# Patient Record
Sex: Male | Born: 1995 | Race: White | Hispanic: No | Marital: Single | Smoking: Current every day smoker
Health system: Southern US, Community
[De-identification: ages and names within clinical notes are randomized; demographics above are authoritative.]

## PROBLEM LIST (undated history)

## (undated) DIAGNOSIS — J45909 Unspecified asthma, uncomplicated: Secondary | ICD-10-CM

---

## 2020-06-15 ENCOUNTER — Ambulatory Visit: Payer: Self-pay | Admitting: Medical-Surgical

## 2020-06-15 DIAGNOSIS — Z7689 Persons encountering health services in other specified circumstances: Secondary | ICD-10-CM

## 2020-06-15 DIAGNOSIS — Z1159 Encounter for screening for other viral diseases: Secondary | ICD-10-CM

## 2020-06-15 DIAGNOSIS — Z114 Encounter for screening for human immunodeficiency virus [HIV]: Secondary | ICD-10-CM

## 2020-08-26 ENCOUNTER — Emergency Department (HOSPITAL_COMMUNITY)
Admission: EM | Admit: 2020-08-26 | Discharge: 2020-08-26 | Disposition: A | Discharge status: Home / Self Care | Payer: Self-pay | Attending: Emergency Medicine | Admitting: Emergency Medicine

## 2020-08-26 ENCOUNTER — Emergency Department (HOSPITAL_COMMUNITY): Payer: Self-pay

## 2020-08-26 ENCOUNTER — Encounter (HOSPITAL_COMMUNITY): Payer: Self-pay | Admitting: Emergency Medicine

## 2020-08-26 ENCOUNTER — Other Ambulatory Visit: Payer: Self-pay

## 2020-08-26 DIAGNOSIS — R590 Localized enlarged lymph nodes: Secondary | ICD-10-CM

## 2020-08-26 DIAGNOSIS — J020 Streptococcal pharyngitis: Secondary | ICD-10-CM | POA: Insufficient documentation

## 2020-08-26 DIAGNOSIS — F17200 Nicotine dependence, unspecified, uncomplicated: Secondary | ICD-10-CM | POA: Insufficient documentation

## 2020-08-26 DIAGNOSIS — L040 Acute lymphadenitis of face, head and neck: Secondary | ICD-10-CM | POA: Insufficient documentation

## 2020-08-26 LAB — GROUP A STREP BY PCR: Group A Strep by PCR: DETECTED — AB

## 2020-08-26 LAB — MONONUCLEOSIS SCREEN: Mono Screen: POSITIVE — AB

## 2020-08-26 MED ORDER — AMOXICILLIN 500 MG PO CAPS: 500.0000 mg | ORAL_CAPSULE | Freq: Two times a day (BID) | ORAL | 0 refills | Status: AC

## 2020-08-26 NOTE — Discharge Instructions (Addendum)
Please take the entire course of amoxicillin, 1 tablet twice a day for the next 10 days You may take naproxen twice a day as needed for pain in your throat Please follow-up your MyChart to look for the results of your monotest I would highly recommend that you follow-up with a family doctor within 2 weeks for a recheck of your lymph nodes, the glands in your neck should get smaller with the antibiotics, this definitely needs to be rechecked to make sure they are getting better Please make sure that you are not using IV drugs as this can cause certain bloodstream infections that can be quite serious and even fatal Emergency department for severe or worsening symptoms

## 2020-08-26 NOTE — ED Provider Notes (Signed)
[facility] EMERGENCY DEPARTMENT Provider Note   CSN: [id number] Arrival date & time: 08/26/20  1653     History Chief Complaint  Patient presents with  . Sore Throat    Francis Montgomery is a 25 y.o. male.  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  The patient states that about a week ago he developed some lymphadenopathy on the right side of his neck, sore throat, some drainage, some coughing and has since that time coughed up some dark appearing material and what he thought was some blood in the sputum.  At this time he feels like he has some postnasal drip and a sore throat.  He denies any definite exposure to gonorrhea, denies any known exposure to mononucleosis, he has not had fevers or chills at this time.  He denies abdominal pain, nausea, diarrhea, swelling of the legs or rashes or any other complaints.  Symptoms are persistent, nothing seems to make it better or worse.  This is his first medical evaluation for this  History reviewed. No pertinent past medical history.  There are no problems to display for this patient.   History reviewed. No pertinent surgical history.     No family history on file.  Social History   Tobacco Use  . Smoking status: Current Every Day Smoker  Substance Use Topics  . Alcohol use: Yes  . Drug use: Never    Home Medications Prior to Admission medications   Medication Sig Start Date End Date Taking? Authorizing Provider  amoxicillin (AMOXIL) 500 MG capsule Take 1 capsule (500 mg total) by mouth 2 (two) times daily for 10 days. 08/26/20 09/05/20 Yes Eber Hong, MD    Allergies    Patient has no known allergies.  Review of Systems   Review of Systems  Physical Exam Updated Vital Signs BP 129/90 (BP Location: Left Arm)   Pulse (!) 109   Temp 99.1 F (37.3 C)   Resp 18   SpO2 100%   Physical  Exam Vitals and nursing note reviewed.  Constitutional:      General: He is not in acute distress.    Appearance: He is well-developed.  HENT:     Head: Normocephalic and atraumatic.     Nose: No congestion or rhinorrhea.     Mouth/Throat:     Mouth: Mucous membranes are moist.     Pharynx: Oropharyngeal exudate and posterior oropharyngeal erythema present. No pharyngeal swelling or uvula swelling.     Comments: There is no trismus or torticollis, he is able to fully range of motion his neck, his oropharynx is erythematous with a small amount of purulent material on the tonsils and dripping down the back of the throat, tongue is normal, phonation is normal Eyes:     General: No scleral icterus.       Right eye: No discharge.        Left eye: No discharge.     Conjunctiva/sclera: Conjunctivae normal.     Pupils: Pupils are equal, round, and reactive to light.  Neck:     Thyroid: No thyromegaly.     Vascular: No JVD.     Comments: Isolated enlarged mildly tender cervical lymph node in the right anterior chain Cardiovascular:     Rate and Rhythm: Normal rate and regular rhythm.     Heart sounds: Normal heart sounds. No murmur  heard. No friction rub. No gallop.   Pulmonary:     Effort: Pulmonary effort is normal. No respiratory distress.     Breath sounds: Normal breath sounds. No wheezing or rales.  Abdominal:     General: Bowel sounds are normal. There is no distension.     Palpations: Abdomen is soft. There is no mass.     Tenderness: There is no abdominal tenderness.     Comments: No hepatosplenomegaly  Musculoskeletal:        General: No tenderness. Normal range of motion.     Cervical back: Normal range of motion and neck supple.  Lymphadenopathy:     Cervical: Cervical adenopathy present.  Skin:    General: Skin is warm and dry.     Findings: No erythema or rash.  Neurological:     Mental Status: He is alert.     Coordination: Coordination normal.  Psychiatric:         Behavior: Behavior normal.     ED Results / Procedures / Treatments   Labs (all labs ordered are listed, but only abnormal results are displayed) Labs Reviewed  GROUP A STREP BY PCR - Abnormal; Notable for the following components:      Result Value   Group A Strep by PCR DETECTED (*)    All other components within normal limits  MONONUCLEOSIS SCREEN    EKG None  Radiology DG Chest 2 View  Result Date: 08/26/2020 CLINICAL DATA:  Productive cough EXAM: CHEST - 2 VIEW COMPARISON:  None. FINDINGS: The heart size and mediastinal contours are within normal limits. Both lungs are clear. The visualized skeletal structures are unremarkable. IMPRESSION: No active cardiopulmonary disease. Electronically Signed   By: Jasmine Pang M.D.   On: 08/26/2020 17:34    Procedures Procedures   Medications Ordered in ED Medications - No data to display  ED Course  I have reviewed the triage vital signs and the nursing notes.  Pertinent labs & imaging results that were available during my care of the patient were reviewed by me and considered in my medical decision making (see chart for details).    MDM Rules/Calculators/A&P                          The patient's chest x-ray is unremarkable, there is no signs of pneumonia, his rapid strep test was positive, the monotest is pending and I have recommended that the patient be evaluated in the office within 2 weeks for a recheck to make sure he has improved lymphadenopathy, also want to make sure he does not have any other lymphadenopathy though I did not see any on today's exam.  The patient is agreeable and will watch his results on MyChart  Final Clinical Impression(s) / ED Diagnoses Final diagnoses:  Acute streptococcal pharyngitis  Cervical lymphadenopathy    Rx / DC Orders ED Discharge Orders         Ordered    amoxicillin (AMOXIL) 500 MG capsule  2 times daily        08/26/20 1958           Eber Hong, MD 08/26/20 2003

## 2020-08-26 NOTE — ED Triage Notes (Signed)
Pt c/o sore throat, lymph node swelling, cough and dark mucous.

## 2020-08-26 NOTE — ED Provider Notes (Signed)
Emergency Medicine Provider Triage Evaluation Note  Francis Montgomery 25 y.o. male was evaluated in triage.  Pt complains of swollen nodes, sore throat, cough.  States that about a week ago, he noticed that his lymph node, particularly on the right side of his neck was swollen.  States then he started feeling like his throat was irritated and noted some white spots on it.  He has not any difficulty swallowing, duct tolerating secretions, difficulty breathing.  That he has had some low-grade fevers at home.  No night sweats, weight loss.  He states today he started coughing and started noticing some dark mucus when he coughed up.  He does smoke cigarettes about half pack of cigarettes a day.  No difficulty breathing, chest pain.   Review of Systems  Positive: Cough, lymphadenopathy, sore through Negative: CP  Physical Exam  BP 134/82   Pulse 70   Temp 98.2 F (36.8 C) (Oral)   Resp 18   Ht 5\' 4"  (1.626 m)   Wt 65.8 kg   SpO2 100%   BMI 24.89 kg/m  Gen:   Awake, no distress   HEENT:  Atraumatic.  Anterior cervical lymphadenopathy noted on the right.  Posterior oropharynx is erythematous.  Uvula is midline, airways patent, phonation is intact. Resp:  Normal effort  Cardiac:  Normal rate  Abd:   Nondistended, nontender  MSK:   Moves extremities without difficulty  Neuro:  Speech clear   Other:  N/A    Medical Decision Making  Medically screening exam initiated at 5:01 PM  Appropriate orders placed.  Francis Montgomery was informed that the remainder of the evaluation will be completed by another provider, this initial triage assessment does not replace that evaluation, and the importance of remaining in the ED until their evaluation is complete.   Clinical Impression  Swollen lymph nodes    Portions of this note were generated with Dragon dictation software. Dictation errors may occur despite best attempts at proofreading.     Leta Jungling, PA-C 08/26/20 1703    10/26/20,  MD 08/26/20 [phone]

## 2020-12-14 ENCOUNTER — Other Ambulatory Visit: Payer: Self-pay

## 2020-12-14 ENCOUNTER — Emergency Department (HOSPITAL_BASED_OUTPATIENT_CLINIC_OR_DEPARTMENT_OTHER): Payer: Self-pay

## 2020-12-14 ENCOUNTER — Encounter (HOSPITAL_BASED_OUTPATIENT_CLINIC_OR_DEPARTMENT_OTHER): Payer: Self-pay | Admitting: *Deleted

## 2020-12-14 ENCOUNTER — Emergency Department (HOSPITAL_BASED_OUTPATIENT_CLINIC_OR_DEPARTMENT_OTHER)
Admission: EM | Admit: 2020-12-14 | Discharge: 2020-12-14 | Disposition: A | Discharge status: Home / Self Care | Payer: Self-pay | Attending: Emergency Medicine | Admitting: Emergency Medicine

## 2020-12-14 DIAGNOSIS — J45909 Unspecified asthma, uncomplicated: Secondary | ICD-10-CM | POA: Insufficient documentation

## 2020-12-14 DIAGNOSIS — M25512 Pain in left shoulder: Secondary | ICD-10-CM | POA: Insufficient documentation

## 2020-12-14 DIAGNOSIS — J029 Acute pharyngitis, unspecified: Secondary | ICD-10-CM | POA: Insufficient documentation

## 2020-12-14 DIAGNOSIS — R21 Rash and other nonspecific skin eruption: Secondary | ICD-10-CM | POA: Insufficient documentation

## 2020-12-14 DIAGNOSIS — Z711 Person with feared health complaint in whom no diagnosis is made: Secondary | ICD-10-CM

## 2020-12-14 DIAGNOSIS — F17210 Nicotine dependence, cigarettes, uncomplicated: Secondary | ICD-10-CM | POA: Insufficient documentation

## 2020-12-14 DIAGNOSIS — R590 Localized enlarged lymph nodes: Secondary | ICD-10-CM | POA: Insufficient documentation

## 2020-12-14 DIAGNOSIS — Z8619 Personal history of other infectious and parasitic diseases: Secondary | ICD-10-CM | POA: Insufficient documentation

## 2020-12-14 HISTORY — DX: Unspecified asthma, uncomplicated: J45.909

## 2020-12-14 LAB — CBC WITH DIFFERENTIAL/PLATELET
Abs Immature Granulocytes: 0.04 10*3/uL (ref 0.00–0.07)
Basophils Absolute: 0.1 10*3/uL (ref 0.0–0.1)
Basophils Relative: 1 %
Eosinophils Absolute: 0.1 10*3/uL (ref 0.0–0.5)
Eosinophils Relative: 1 %
HCT: 42.3 % (ref 39.0–52.0)
Hemoglobin: 14.2 g/dL (ref 13.0–17.0)
Immature Granulocytes: 0 %
Lymphocytes Relative: 65 %
Lymphs Abs: 7.2 10*3/uL — ABNORMAL HIGH (ref 0.7–4.0)
MCH: 28.6 pg (ref 26.0–34.0)
MCHC: 33.6 g/dL (ref 30.0–36.0)
MCV: 85.1 fL (ref 80.0–100.0)
Monocytes Absolute: 0.5 10*3/uL (ref 0.1–1.0)
Monocytes Relative: 5 %
Neutro Abs: 3.1 10*3/uL (ref 1.7–7.7)
Neutrophils Relative %: 28 %
Platelets: 343 10*3/uL (ref 150–400)
RBC: 4.97 MIL/uL (ref 4.22–5.81)
RDW: 14 % (ref 11.5–15.5)
Smear Review: ADEQUATE
WBC Morphology: ABNORMAL
WBC: 11.1 10*3/uL — ABNORMAL HIGH (ref 4.0–10.5)
nRBC: 0 % (ref 0.0–0.2)

## 2020-12-14 LAB — BASIC METABOLIC PANEL
Anion gap: 9 (ref 5–15)
BUN: 14 mg/dL (ref 6–20)
CO2: 26 mmol/L (ref 22–32)
Calcium: 8.4 mg/dL — ABNORMAL LOW (ref 8.9–10.3)
Chloride: 102 mmol/L (ref 98–111)
Creatinine, Ser: 0.93 mg/dL (ref 0.61–1.24)
GFR, Estimated: >60 mL/min (ref >60)
Glucose, Bld: 88 mg/dL (ref 70–99)
Potassium: 3.9 mmol/L (ref 3.5–5.1)
Sodium: 137 mmol/L (ref 135–145)

## 2020-12-14 LAB — RAPID HIV SCREEN (HIV 1/2 AB+AG)
HIV 1/2 Antibodies: NONREACTIVE
HIV-1 P24 Antigen - HIV24: NONREACTIVE

## 2020-12-14 LAB — MONONUCLEOSIS SCREEN: Mono Screen: NEGATIVE

## 2020-12-14 LAB — GROUP A STREP BY PCR: Group A Strep by PCR: NOT DETECTED

## 2020-12-14 NOTE — ED Provider Notes (Signed)
[facility] Provider Note: Lowella Dell, MD, FACEP  CSN: [id number] MRN: [id number] ARRIVAL: 12/14/20 at 0101 ROOM: [facility]   CHIEF COMPLAINT  Lymphadenopathy   HISTORY OF PRESENT ILLNESS  12/14/20 1:46 AM Francis Montgomery is a 25 y.o. male with a history of methamphetamine abuse including IV drug use.  He is here with about a week of enlarged, but not tender, lymph nodes of his neck and inguinal regions.  He is concerned he may have a blood infection due to IV drug use and would like to be treated for this.  He has also had a sore throat.  He has a history of mononucleosis about 3 months ago and strep pharyngitis.  He has scattered maculopapular rash and has been having 3 weeks of pain in his left shoulder.  He rates his pain as a 9 out of 10, worse with movement.  He states he is going to start drug rehab soon and has been "clean" for 3 days.   Past Medical History:  Diagnosis Date   Asthma     History reviewed. No pertinent surgical history.  History reviewed. No pertinent family history.  Social History   Tobacco Use   Smoking status: Every Day    Types: Cigarettes  Substance Use Topics   Alcohol use: Yes   Drug use: Yes    Types: IV    Prior to Admission medications   Medication Sig Start Date End Date Taking? Authorizing Provider  sertraline (ZOLOFT) 50 MG tablet Take 50 mg by mouth daily.   Yes [provider]    Allergies Patient has no known allergies.   REVIEW OF SYSTEMS  Negative except as noted here or in the History of Present Illness.   PHYSICAL EXAMINATION  Initial Vital Signs Blood pressure 119/78, pulse 91, temperature 98.1 F (36.7 C), temperature source Oral, resp. rate 18, height 6' (1.829 m), weight 79.4 kg, SpO2 100 %.  Examination General: Well-developed, well-nourished male in no acute distress; appearance consistent with age of record HENT: normocephalic; atraumatic; mild pharyngeal erythema without exudate Eyes:  pupils equal, round and reactive to light; extraocular muscles intact Neck: supple; nontender anterior cervical lymph nodes Heart: regular rate and rhythm Lungs: clear to auscultation bilaterally Abdomen: soft; nondistended; nontender; no palpable inguinal lymphadenopathy; bowel sounds present Extremities: No deformity; full range of motion except pain on range of motion of left shoulder; no palpable axillary lymphadenopathy; pulses normal Neurologic: Awake, alert and oriented; motor function intact in all extremities and symmetric; no facial droop Skin: Warm and dry; diffuse, sparse maculopapular rash at different stages of healing Psychiatric: Normal mood and affect   RESULTS  Summary of this visit's results, reviewed and interpreted by myself:   EKG Interpretation  Date/Time:    Ventricular Rate:    PR Interval:    QRS Duration:   QT Interval:    QTC Calculation:   R Axis:     Text Interpretation:         Laboratory Studies: Results for orders placed or performed during the hospital encounter of 12/14/20 (from the past 24 hour(s))  Mononucleosis screen     Status: None   Collection Time: 12/14/20  2:12 AM  Result Value Ref Range   Mono Screen NEGATIVE NEGATIVE  CBC with Differential/Platelet     Status: Abnormal   Collection Time: 12/14/20  2:12 AM  Result Value Ref Range   WBC 11.1 (H) 4.0 - 10.5 K/uL   RBC 4.97 4.22 -  5.81 MIL/uL   Hemoglobin 14.2 13.0 - 17.0 g/dL   HCT 43.3 29.5 - 18.8 %   MCV 85.1 80.0 - 100.0 fL   MCH 28.6 26.0 - 34.0 pg   MCHC 33.6 30.0 - 36.0 g/dL   RDW 41.6 60.6 - 30.1 %   Platelets 343 150 - 400 K/uL   nRBC 0.0 0.0 - 0.2 %   Neutrophils Relative % 28 %   Neutro Abs 3.1 1.7 - 7.7 K/uL   Lymphocytes Relative 65 %   Lymphs Abs 7.2 (H) 0.7 - 4.0 K/uL   Monocytes Relative 5 %   Monocytes Absolute 0.5 0.1 - 1.0 K/uL   Eosinophils Relative 1 %   Eosinophils Absolute 0.1 0.0 - 0.5 K/uL   Basophils Relative 1 %   Basophils Absolute 0.1 0.0 -  0.1 K/uL   WBC Morphology Abnormal lymphocytes present    RBC Morphology MORPHOLOGY UNREMARKABLE    Smear Review PLATELETS APPEAR ADEQUATE    Immature Granulocytes 0 %   Abs Immature Granulocytes 0.04 0.00 - 0.07 K/uL  Basic metabolic panel     Status: Abnormal   Collection Time: 12/14/20  2:12 AM  Result Value Ref Range   Sodium 137 135 - 145 mmol/L   Potassium 3.9 3.5 - 5.1 mmol/L   Chloride 102 98 - 111 mmol/L   CO2 26 22 - 32 mmol/L   Glucose, Bld 88 70 - 99 mg/dL   BUN 14 6 - 20 mg/dL   Creatinine, Ser 6.01 0.61 - 1.24 mg/dL   Calcium 8.4 (L) 8.9 - 10.3 mg/dL   GFR, Estimated >09 >32 mL/min   Anion gap 9 5 - 15  Rapid HIV screen (HIV 1/2 Ab+Ag)     Status: None   Collection Time: 12/14/20  2:12 AM  Result Value Ref Range   HIV-1 P24 Antigen - HIV24 NON REACTIVE NON REACTIVE   HIV 1/2 Antibodies NON REACTIVE NON REACTIVE   Interpretation (HIV Ag Ab)      A non reactive test result means that HIV 1 or HIV 2 antibodies and HIV 1 p24 antigen were not detected in the specimen.  Group A Strep by PCR     Status: None   Collection Time: 12/14/20  2:12 AM   Specimen: Peripheral; Sterile Swab  Result Value Ref Range   Group A Strep by PCR NOT DETECTED NOT DETECTED   Imaging Studies: DG Shoulder Left  Result Date: 12/14/2020 CLINICAL DATA:  Pain EXAM: LEFT SHOULDER - 2+ VIEW COMPARISON:  None. FINDINGS: No fracture or dislocation is seen. The joint spaces are preserved. Visualized soft tissues are within normal limits. Visualized left lung is clear. IMPRESSION: Negative. Electronically Signed   By: Charline Bills M.D.   On: 12/14/2020 03:35    ED COURSE and MDM  Nursing notes, initial and subsequent vitals signs, including pulse oximetry, reviewed and interpreted by myself.  Vitals:   12/14/20 0114 12/14/20 0115 12/14/20 0200 12/14/20 0219  BP: 119/78  110/76   Pulse: 91  96   Resp: 18  16   Temp: 98.1 F (36.7 C)     TempSrc: Oral     SpO2: 100%  100% 100%  Weight:   79.4 kg    Height:  6' (1.829 m)     Medications - No data to display  Patient advised of reassuring diagnostic studies including negative HIV test.  The blood cultures and pathology smear review are not yet resulted but these are flagged  for follow-up.  He was advised to take Aleve or ibuprofen for his left shoulder pain.  The rash is likely due to methamphetamine abuse.  PROCEDURES  Procedures   ED DIAGNOSES     ICD-10-CM   1. Concern about infectious disease without diagnosis  Z71.1          Paulena Servais, MD 12/14/20 [id number]

## 2020-12-14 NOTE — ED Triage Notes (Signed)
Pt reports swollen lymph nodes-hx of mono and strep-completed antibiotics. States blood in stool, left shoulder pain also. Pt requests to be treated for 'infection in blood' due to IV drug use.

## 2020-12-15 LAB — PATHOLOGIST SMEAR REVIEW: Path Review: REACTIVE

## 2020-12-19 LAB — CULTURE, BLOOD (ROUTINE X 2)
Culture: NO GROWTH
Culture: NO GROWTH
Special Requests: ADEQUATE
Special Requests: ADEQUATE

## 2021-12-14 IMAGING — DX DG CHEST 2V
2 series · 2 of 2 positions shown · non-contrast
Comparison: None.

CLINICAL DATA: Productive cough

EXAM:
CHEST - 2 VIEW

[chest pa]
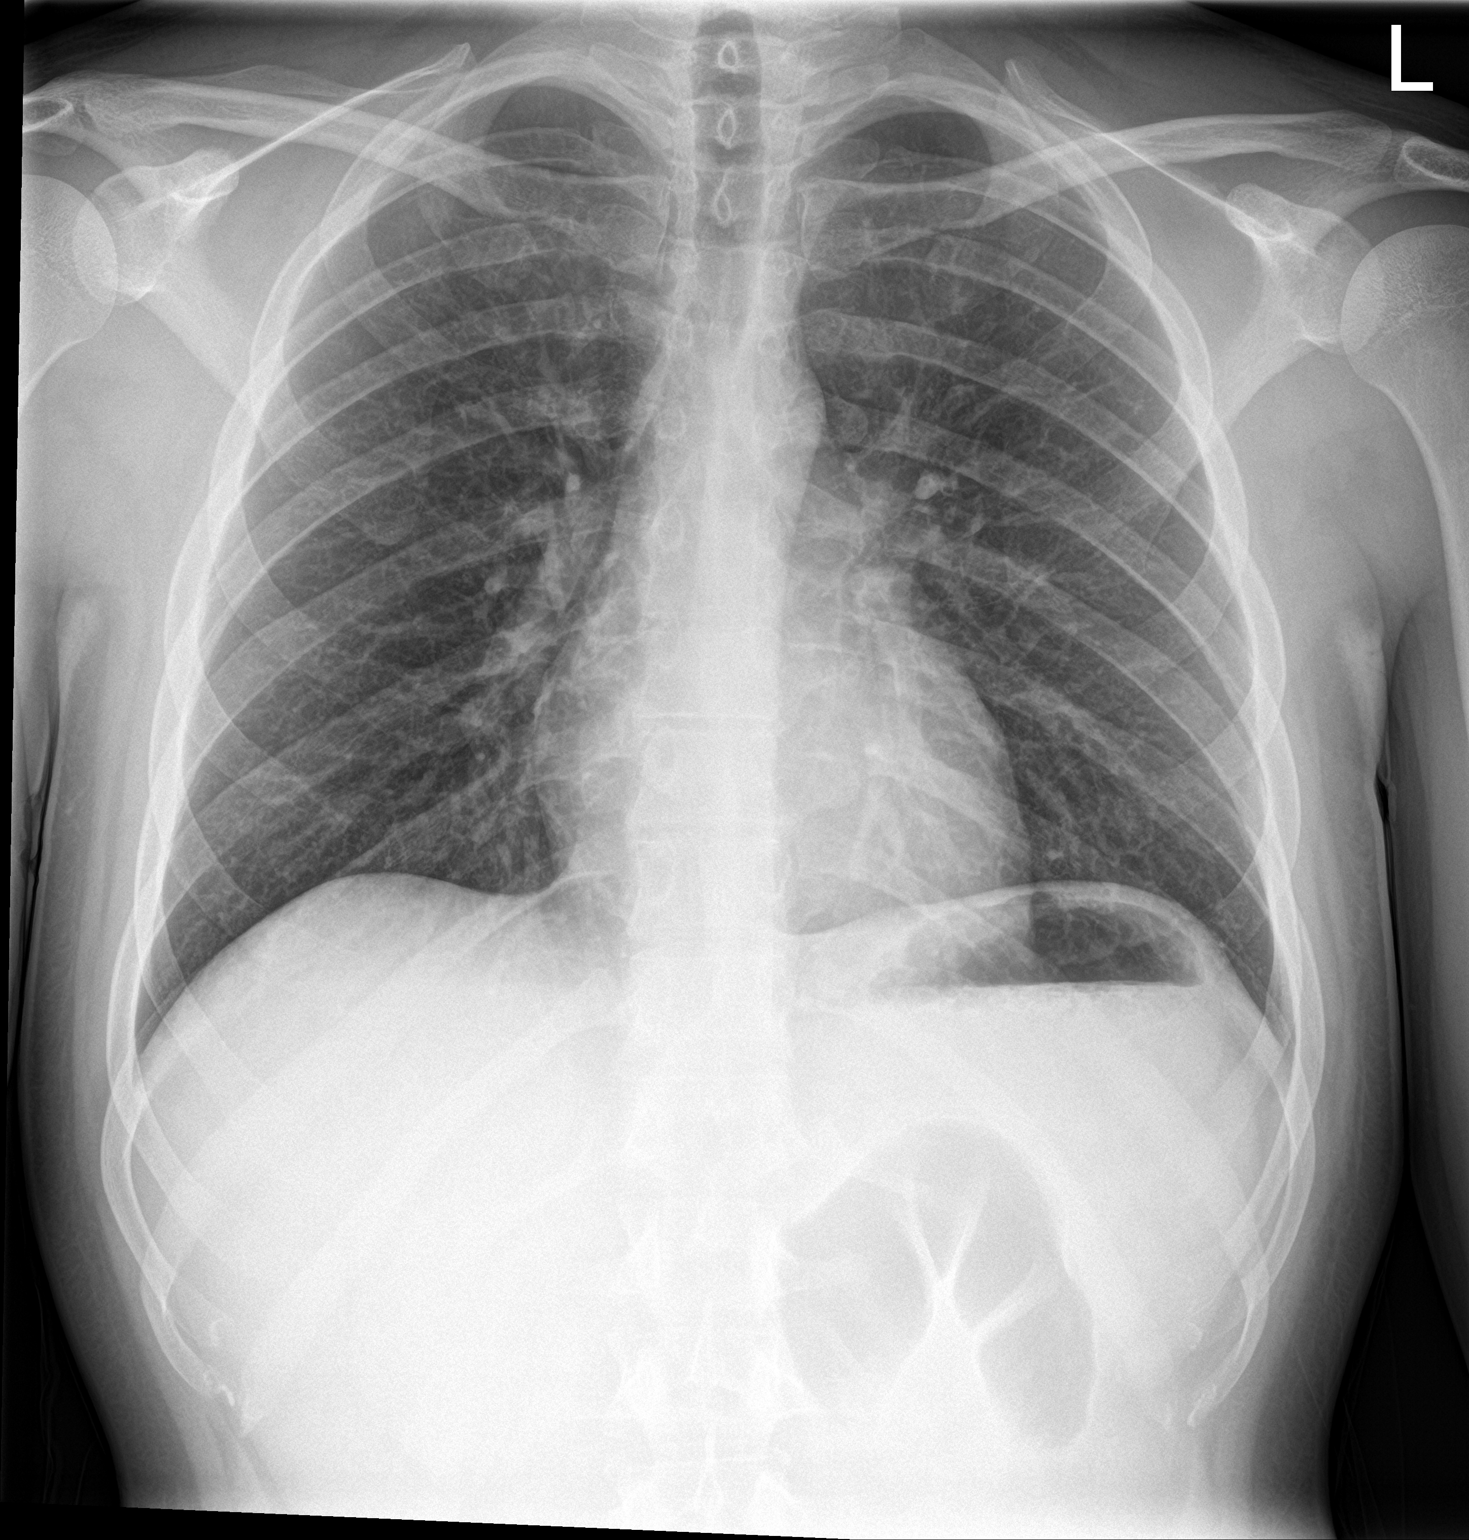

[chest lat]
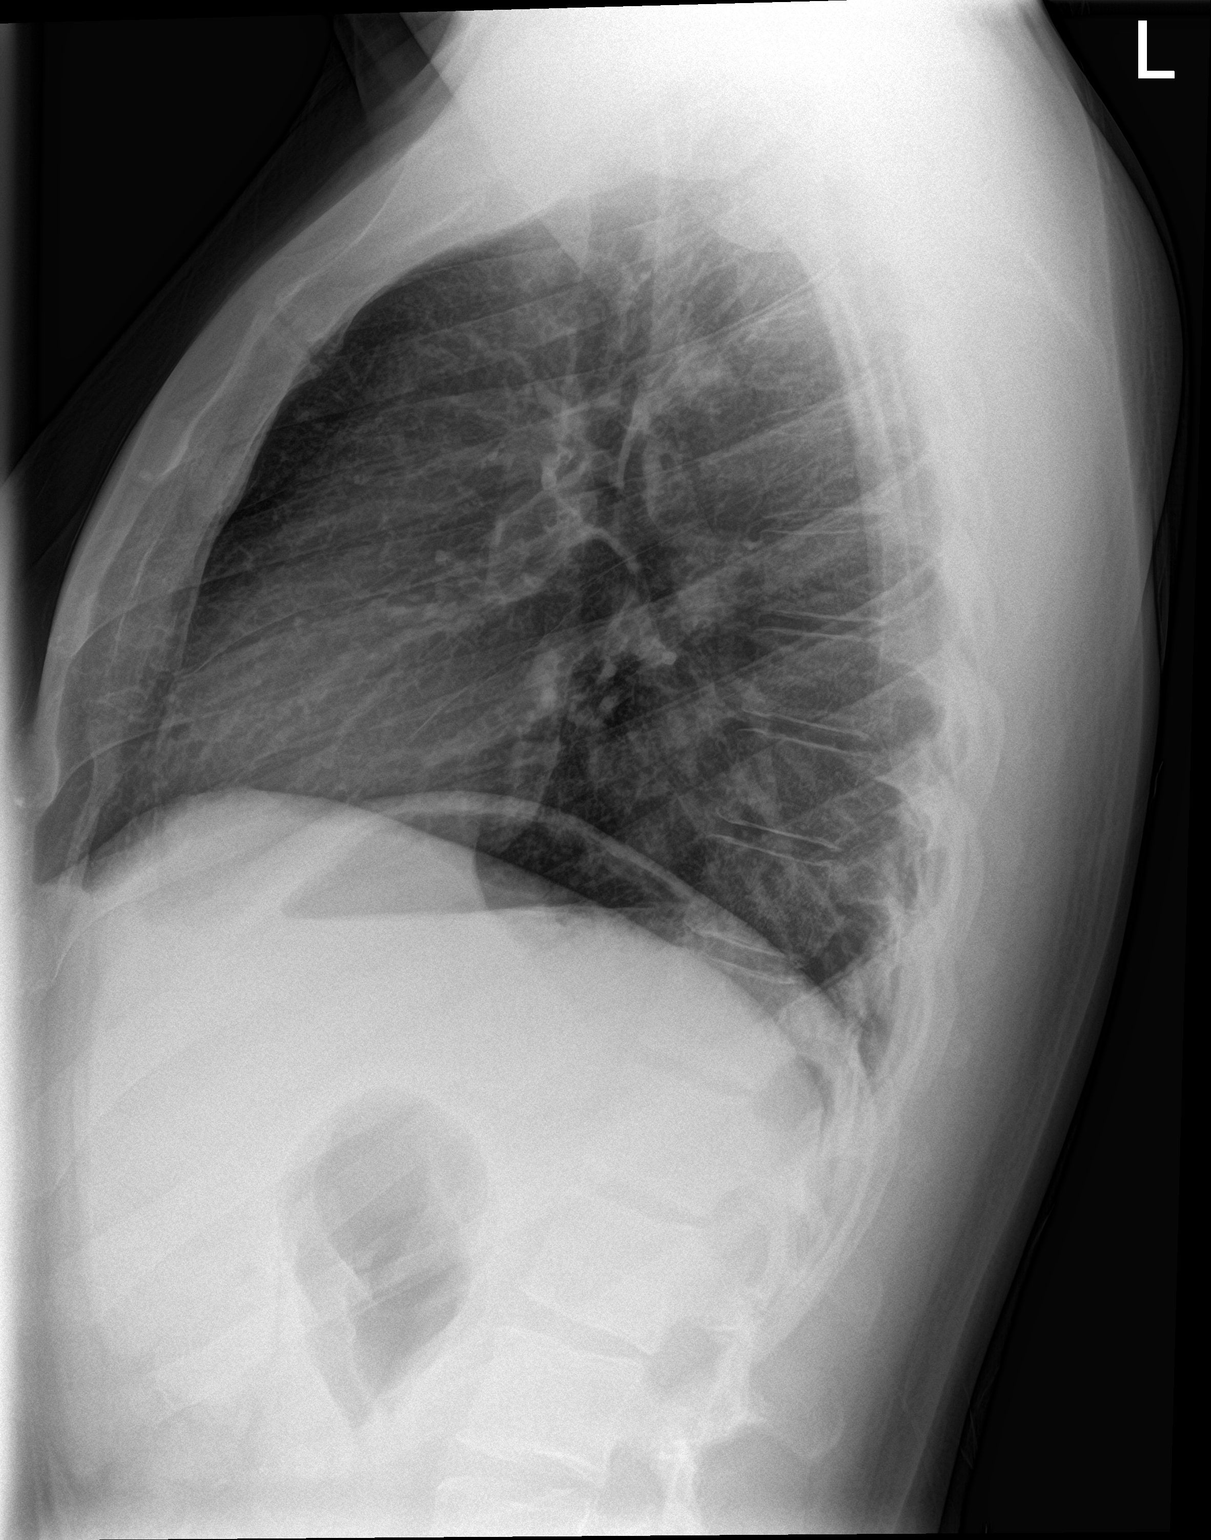

[2 of 2 positions shown; findings below may reference images not displayed]

FINDINGS: The heart size and mediastinal contours are within normal limits.
Both lungs are clear. The visualized skeletal structures are
unremarkable.
IMPRESSION: No active cardiopulmonary disease.
